# Patient Record
Sex: Male | Born: 1980 | Race: White | Hispanic: No | Marital: Single | Smoking: Never smoker
Health system: Southern US, Community
[De-identification: ages and names within clinical notes are randomized; demographics above are authoritative.]

## PROBLEM LIST (undated history)

## (undated) ENCOUNTER — Emergency Department (HOSPITAL_COMMUNITY): Payer: BC Managed Care – PPO

## (undated) DIAGNOSIS — E663 Overweight: Secondary | ICD-10-CM

## (undated) DIAGNOSIS — IMO0002 Reserved for concepts with insufficient information to code with codable children: Secondary | ICD-10-CM

## (undated) DIAGNOSIS — R51 Headache: Secondary | ICD-10-CM

## (undated) HISTORY — DX: Overweight: E66.3

## (undated) HISTORY — DX: Reserved for concepts with insufficient information to code with codable children: IMO0002

## (undated) HISTORY — DX: Headache: R51

## (undated) HISTORY — PX: NASAL POLYP EXCISION: SHX2068

---

## 2007-04-13 ENCOUNTER — Encounter (INDEPENDENT_AMBULATORY_CARE_PROVIDER_SITE_OTHER): Payer: Self-pay | Admitting: Specialist

## 2007-04-13 ENCOUNTER — Ambulatory Visit (HOSPITAL_COMMUNITY): Admission: RE | Admit: 2007-04-13 | Discharge: 2007-04-14 | Payer: Self-pay | Admitting: Otolaryngology

## 2008-07-19 IMAGING — CR DG CHEST 2V
2 series · 2 of 2 positions shown · non-contrast
Comparison: none

CLINICAL DATA: Deviated septum.  Preoperative chest. 
 CHEST - 2 VIEW:

[view not recorded (1 of 2)]
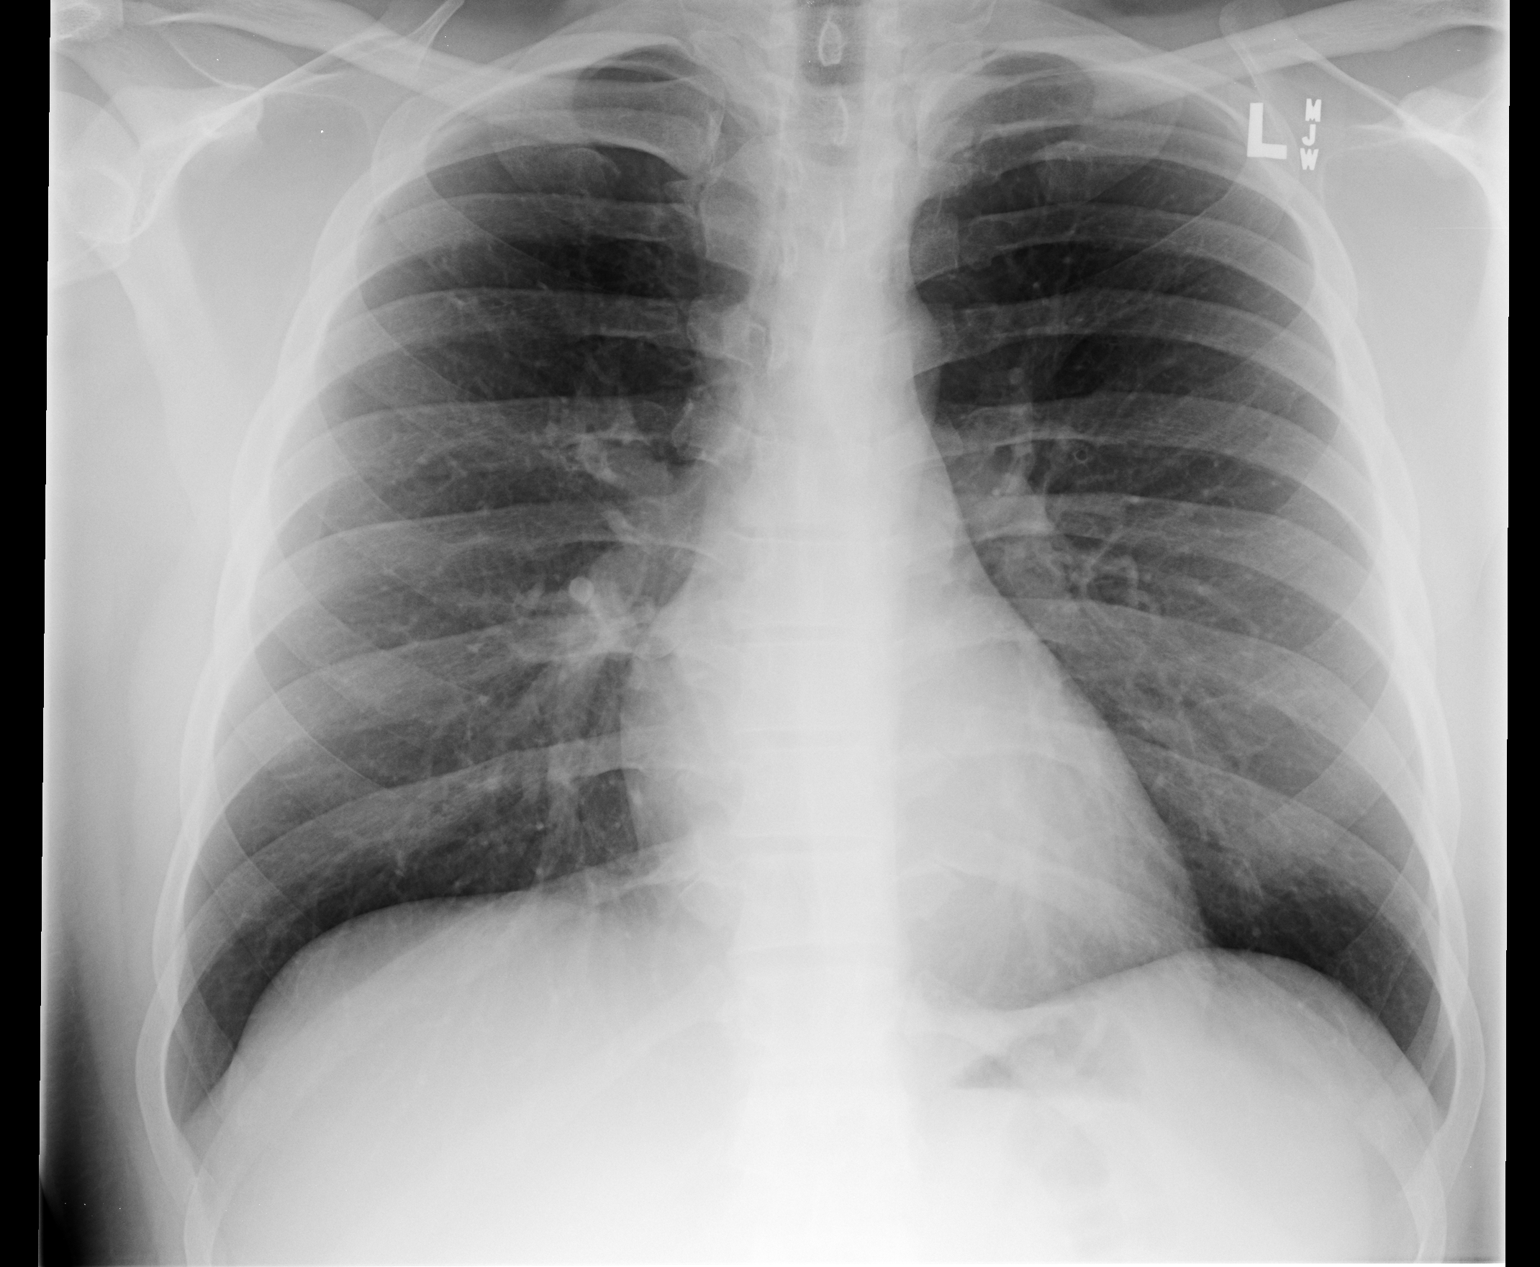

[view not recorded (2 of 2)]
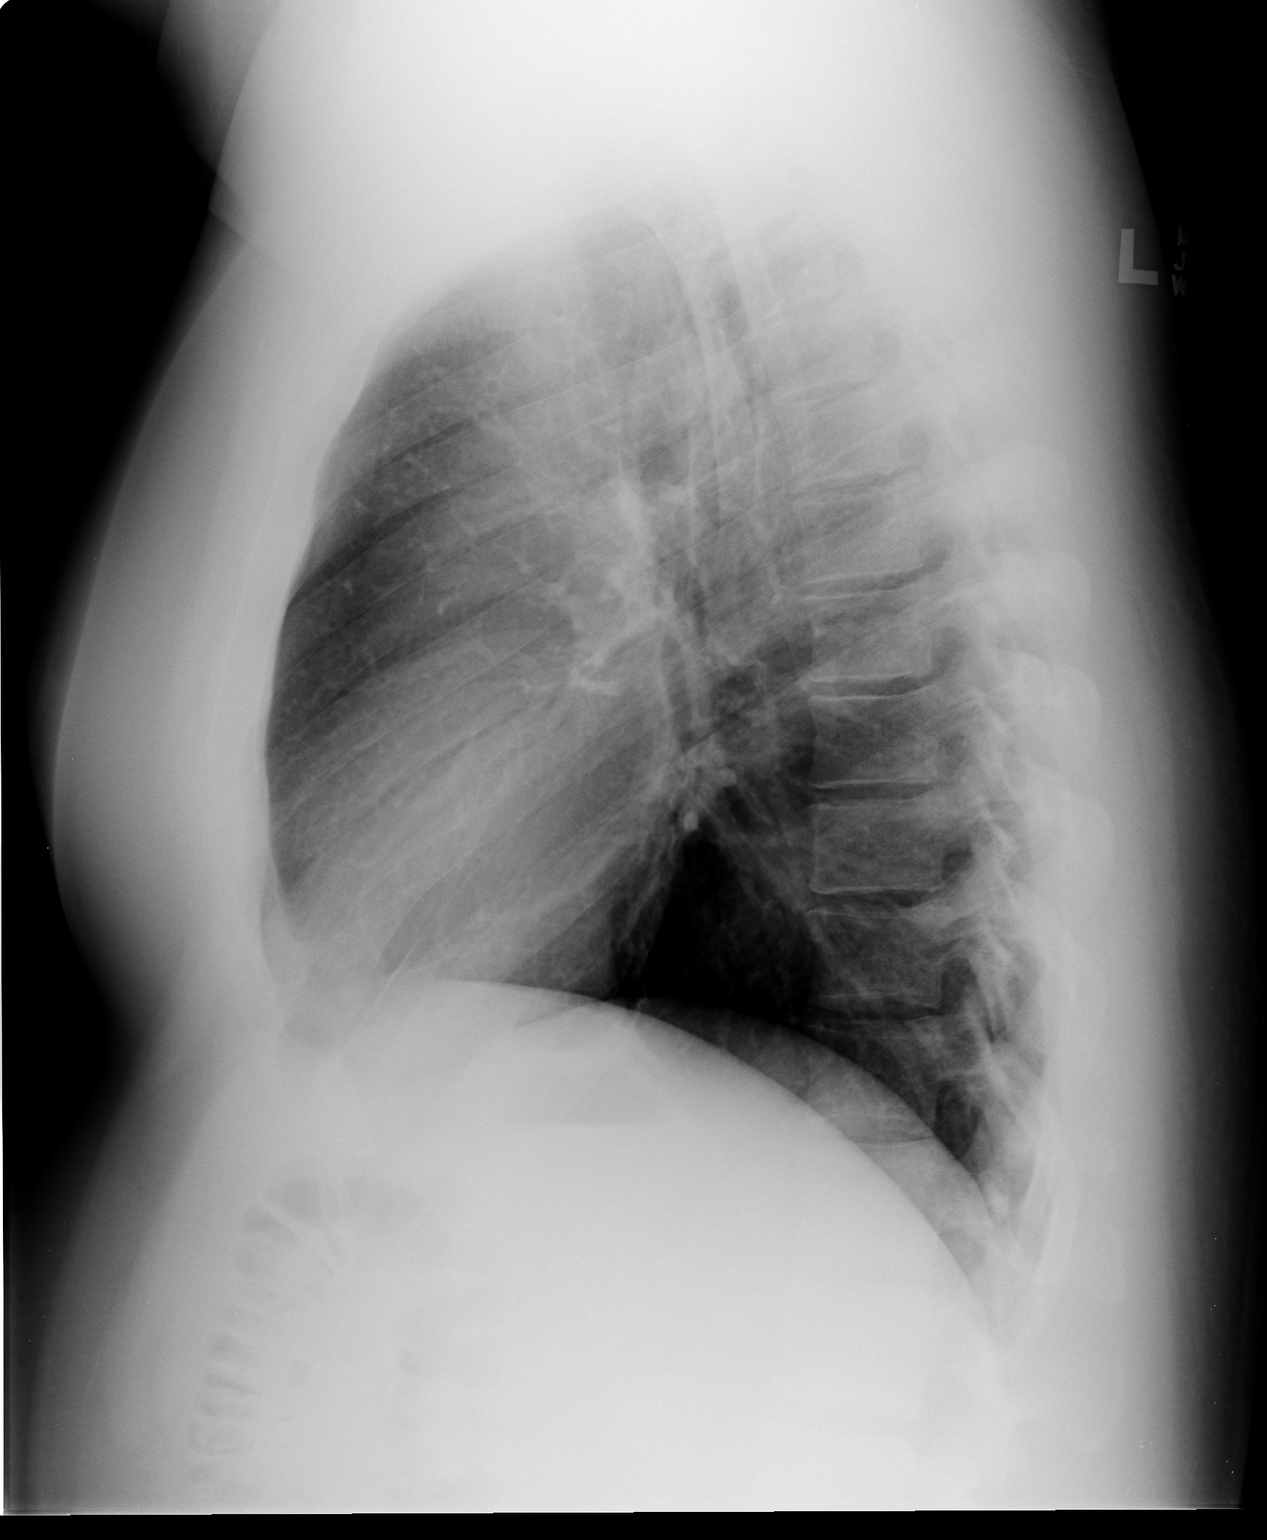

[2 of 2 positions shown; findings below may reference images not displayed]

FINDINGS: The lungs are clear and the heart and mediastinal structures are normal.
IMPRESSION: No evidence for active chest disease.

## 2008-08-31 ENCOUNTER — Emergency Department (HOSPITAL_COMMUNITY): Admission: EM | Admit: 2008-08-31 | Discharge: 2008-08-31 | Payer: Self-pay | Admitting: Emergency Medicine

## 2009-12-09 IMAGING — CR DG CHEST 2V
2 series · 2 of 2 positions shown · non-contrast
Comparison: 04/11/2007.

CLINICAL DATA: Cough and fever.  Body aches.

CHEST - 2 VIEW

[w chest pa]
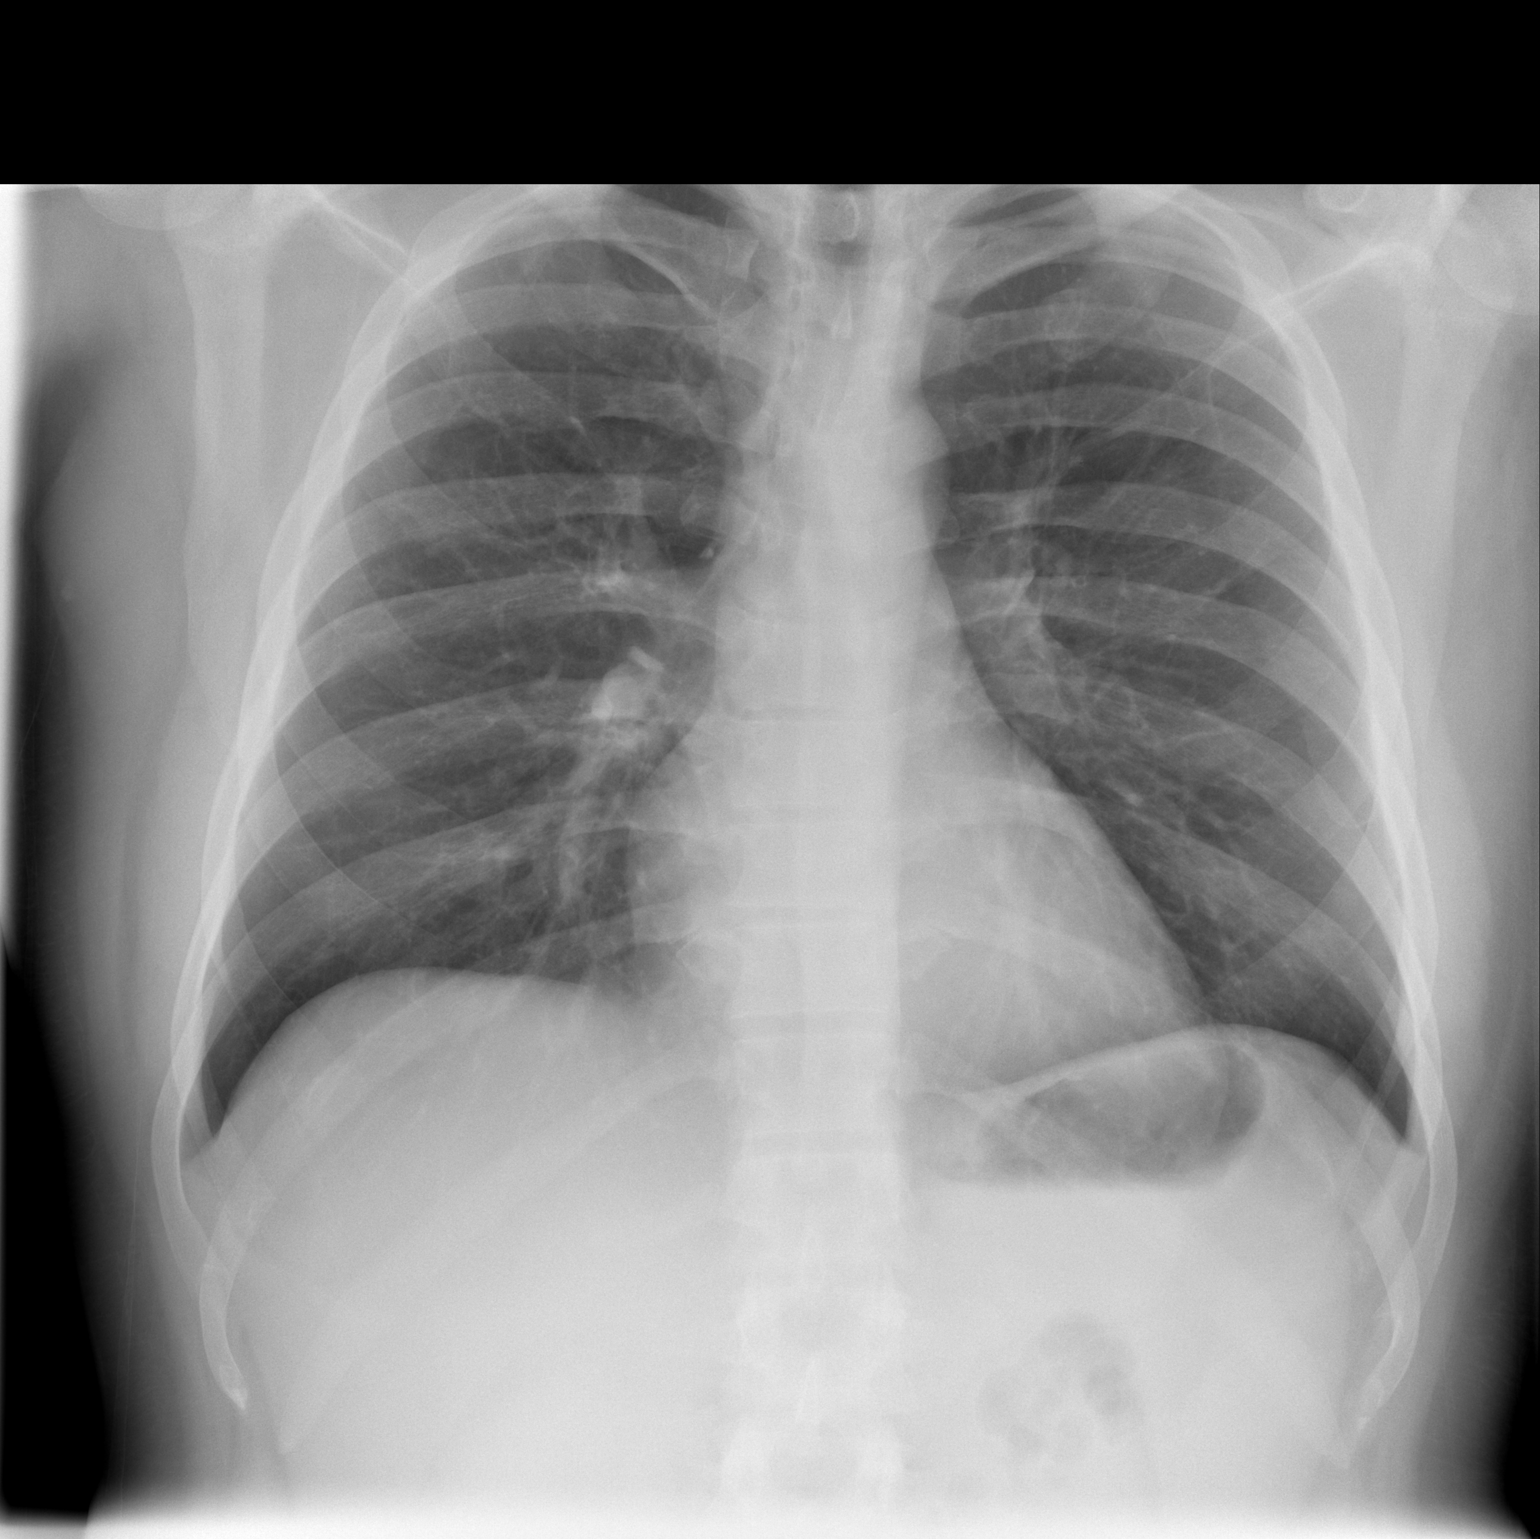

[w chest lat]
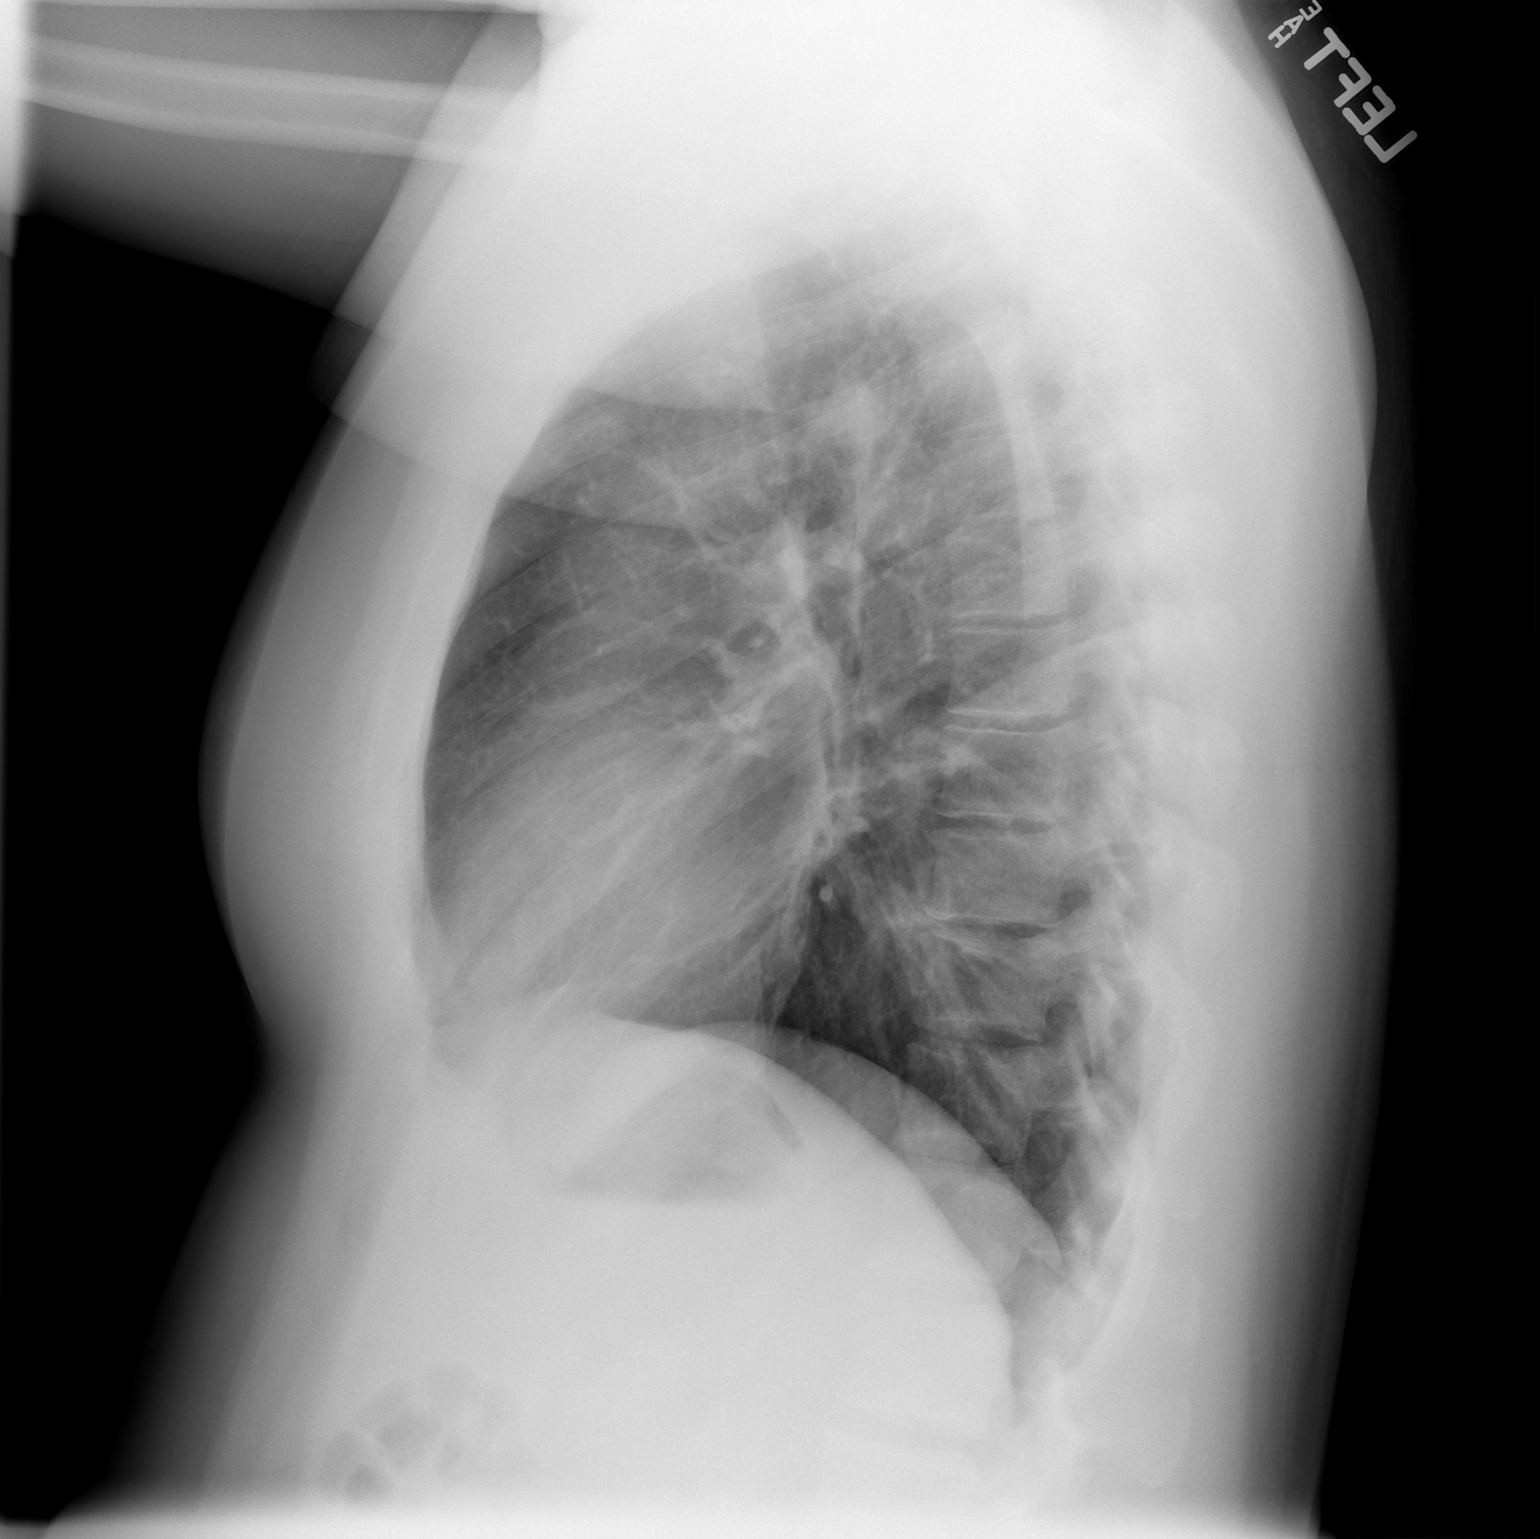

[2 of 2 positions shown; findings below may reference images not displayed]

FINDINGS: The heart size and mediastinal contours are stable.  The
lungs are clear.  There is no pleural effusion or pneumothorax.
IMPRESSION: Stable examination.  No active cardiopulmonary process.

## 2010-02-11 ENCOUNTER — Ambulatory Visit: Payer: Self-pay | Admitting: Internal Medicine

## 2010-02-11 DIAGNOSIS — E663 Overweight: Secondary | ICD-10-CM

## 2010-02-11 DIAGNOSIS — IMO0002 Reserved for concepts with insufficient information to code with codable children: Secondary | ICD-10-CM | POA: Insufficient documentation

## 2010-02-11 HISTORY — DX: Overweight: E66.3

## 2010-02-11 HISTORY — DX: Reserved for concepts with insufficient information to code with codable children: IMO0002

## 2010-02-17 ENCOUNTER — Encounter: Admission: RE | Admit: 2010-02-17 | Discharge: 2010-02-17 | Payer: Self-pay | Admitting: Internal Medicine

## 2010-02-17 ENCOUNTER — Telehealth: Payer: Self-pay | Admitting: Internal Medicine

## 2010-02-23 ENCOUNTER — Encounter: Payer: Self-pay | Admitting: Internal Medicine

## 2010-03-18 ENCOUNTER — Encounter: Payer: Self-pay | Admitting: Internal Medicine

## 2010-09-08 ENCOUNTER — Ambulatory Visit: Payer: Self-pay | Admitting: Internal Medicine

## 2010-09-08 DIAGNOSIS — R51 Headache: Secondary | ICD-10-CM

## 2010-09-08 DIAGNOSIS — R519 Headache, unspecified: Secondary | ICD-10-CM | POA: Insufficient documentation

## 2010-09-08 HISTORY — DX: Headache: R51

## 2010-10-13 ENCOUNTER — Ambulatory Visit: Payer: Self-pay | Admitting: Internal Medicine

## 2010-10-18 ENCOUNTER — Telehealth: Payer: Self-pay | Admitting: Internal Medicine

## 2010-11-17 ENCOUNTER — Ambulatory Visit: Payer: Self-pay | Admitting: Internal Medicine

## 2011-01-04 NOTE — Consult Note (Signed)
Summary: [facility]  [facility]   Imported By: Maryln Gottron 03/17/2010 12:39:03  _____________________________________________________________________  External Attachment:    Type:   Image     Comment:   External Document

## 2011-01-04 NOTE — Assessment & Plan Note (Signed)
Summary: 1  month rov/njr/pt rescd//ccm   Vital Signs:  Patient profile:   30 year old male Weight:      244 pounds Temp:     97.6 degrees F oral BP sitting:   110 / 74  (right arm) Cuff size:   regular  Vitals Entered By: Duard Brady LPN (October 13, 2010 8:02 AM) CC: 1 mos rov - still with migraines    **declines flu vaccine  Is Patient Diabetic? No   CC:  1 mos rov - still with migraines    **declines flu vaccine .  History of Present Illness: 77 -year-old patient who is seen today for follow up of his migraine headaches.  Over the past month.  He has had two severe migraines, lasting several hours that have interfered with his work.  He has had several less severe headaches.  There is been no clear benefit with Maxalt.  He is not tried Excedrin Migraine.  The analgesics relieve the headaches after several hours.  There is sometimes associated with photophobia and nausea. He feels his mother may have had migraine headaches.  He has missed today's at work over the past month due to severe headaches  Allergies (verified): No Known Drug Allergies  Past History:  Past Medical History: Reviewed history from 09/08/2010 and no changes required. lumbar radiculopathy mild obesity Headache  Past Surgical History: Reviewed history from 02/11/2010 and no changes required. nasal polyp, removed 2008  Review of Systems       The patient complains of headaches.  The patient denies anorexia, fever, weight loss, weight gain, vision loss, decreased hearing, hoarseness, chest pain, syncope, dyspnea on exertion, peripheral edema, prolonged cough, hemoptysis, abdominal pain, melena, hematochezia, severe indigestion/heartburn, hematuria, incontinence, genital sores, muscle weakness, suspicious skin lesions, transient blindness, difficulty walking, depression, unusual weight change, abnormal bleeding, enlarged lymph nodes, angioedema, breast masses, and testicular masses.    Physical  Exam  General:  overweight-appearing.  overweight-appearing.   Head:  Normocephalic and atraumatic without obvious abnormalities. No apparent alopecia or balding. Eyes:  No corneal or conjunctival inflammation noted. EOMI. Perrla. Funduscopic exam benign, without hemorrhages, exudates or papilledema. Vision grossly normal. Ears:  External ear exam shows no significant lesions or deformities.  Otoscopic examination reveals clear canals, tympanic membranes are intact bilaterally without bulging, retraction, inflammation or discharge. Hearing is grossly normal bilaterally. Mouth:  Oral mucosa and oropharynx without lesions or exudates.  Teeth in good repair. Neck:  No deformities, masses, or tenderness noted. Lungs:  Normal respiratory effort, chest expands symmetrically. Lungs are clear to auscultation, no crackles or wheezes. Heart:  Normal rate and regular rhythm. S1 and S2 normal without gallop, murmur, click, rub or other extra sounds.   Impression & Recommendations:  Problem # 1:  HEADACHE (ICD-784.0)  His updated medication list for this problem includes:    Tramadol Hcl 50 Mg Tabs (Tramadol hcl) .Marland Kitchen... Prn    Hydrocodone-acetaminophen 5-500 Mg Tabs (Hydrocodone-acetaminophen) ..... One or two every 6 hours as needed for pain due to the  frequency and severity.  Will place on Topamax prophylaxis  His updated medication list for this problem includes:    Tramadol Hcl 50 Mg Tabs (Tramadol hcl) .Marland Kitchen... Prn    Hydrocodone-acetaminophen 5-500 Mg Tabs (Hydrocodone-acetaminophen) ..... One or two every 6 hours as needed for pain  Complete Medication List: 1)  Tramadol Hcl 50 Mg Tabs (Tramadol hcl) .... Prn 2)  Hydrocodone-acetaminophen 5-500 Mg Tabs (Hydrocodone-acetaminophen) .... One or two every 6 hours  as needed for pain 3)  Topiramate 25 Mg Tabs (Topiramate) .... One at bedtime for one week, then one twice daily 4)  Topiramate 100 Mg Tabs (Topiramate) .... One twice daily 5)  Adalat Cc  30 Mg Xr24h-tab (Nifedipine) .... Take one (1) by mouth daily 6)  Topiramate 50 Mg Tabs (Topiramate) .... One twice daily  Patient Instructions: 1)  topamax 25 one at bedtime for one week, 2)  one twice daily for one week; 3)  one am and 2 pm for one week; 4)  50 mg twice daily for one week;and 5)  50 mg am amd 75 mg pm for one week; 6)  75 mg twice daily for one week; 7)  75 am and 100 mg PM for one week; 100 mg twice daily   8)  Please schedule a follow-up appointment in 6 weeks Prescriptions: TOPIRAMATE 50 MG TABS (TOPIRAMATE) one twice daily  #50 x 0   Entered and Authorized by:   Gordy Savers  MD   Signed by:   Gordy Savers  MD on 10/13/2010   Method used:   Print then Give to Patient   RxID:   [id number] TOPIRAMATE 100 MG TABS (TOPIRAMATE) one twice daily  #50 x 0   Entered and Authorized by:   Gordy Savers  MD   Signed by:   Gordy Savers  MD on 10/13/2010   Method used:   Print then Give to Patient   RxID:   [id number] TOPIRAMATE 25 MG TABS (TOPIRAMATE) one at bedtime for one week, then one twice daily  #50 x 0   Entered and Authorized by:   Gordy Savers  MD   Signed by:   Gordy Savers  MD on 10/13/2010   Method used:   Print then Give to Patient   RxID:   [id number]    Orders Added: 1)  Est. Patient Level III [01027]

## 2011-01-04 NOTE — Assessment & Plan Note (Signed)
Summary: HEADACHES//CCM   Vital Signs:  Patient profile:   30 year old male Weight:      237 pounds Temp:     98.0 degrees F oral BP sitting:   122 / 80  (right arm) Cuff size:   regular  Vitals Entered By: Duard Brady LPN (September 08, 2010 8:38 AM) CC: c/o headaches and nausea on/off for sometime Is Patient Diabetic? No   CC:  c/o headaches and nausea on/off for sometime.  History of Present Illness: 30 year old patient who has had 3 to 4 severe headaches over the past two months.  These  occasionally began in the right temporal area, but quickly become generalized.  They are described as quite severe and associated with nausea, light and noise sensitivity.  They last hours and are debilitating.  He feels his mother may have had migraine headaches.  Denies any fever, neck pain, or other constitutional symptoms.  Preventive Screening-Counseling & Management  Alcohol-Tobacco     Smoking Status: never  Allergies (verified): No Known Drug Allergies  Past History:  Past Medical History: lumbar radiculopathy mild obesity Headache  Family History: Reviewed history from 02/11/2010 and no changes required. both parents are in their early to mid 45s with low back pain.  Mother history of herniated disk, and history of hypertension  One half-brother, and one half sister in good health  Social History: Smoking Status:  never  Review of Systems       The patient complains of headaches.  The patient denies anorexia, fever, weight loss, weight gain, vision loss, decreased hearing, hoarseness, chest pain, syncope, dyspnea on exertion, peripheral edema, prolonged cough, hemoptysis, abdominal pain, melena, hematochezia, severe indigestion/heartburn, hematuria, incontinence, genital sores, muscle weakness, suspicious skin lesions, transient blindness, difficulty walking, depression, unusual weight change, abnormal bleeding, enlarged lymph nodes, angioedema, breast masses, and  testicular masses.    Physical Exam  General:  overweight-appearing.  normal blood pressure Head:  Normocephalic and atraumatic without obvious abnormalities. No apparent alopecia or balding. Eyes:  No corneal or conjunctival inflammation noted. EOMI. Perrla. Funduscopic exam benign, without hemorrhages, exudates or papilledema. Vision grossly normal. Ears:  External ear exam shows no significant lesions or deformities.  Otoscopic examination reveals clear canals, tympanic membranes are intact bilaterally without bulging, retraction, inflammation or discharge. Hearing is grossly normal bilaterally. Mouth:  Oral mucosa and oropharynx without lesions or exudates.  Teeth in good repair. Neck:  No deformities, masses, or tenderness noted. Lungs:  Normal respiratory effort, chest expands symmetrically. Lungs are clear to auscultation, no crackles or wheezes. Heart:  Normal rate and regular rhythm. S1 and S2 normal without gallop, murmur, click, rub or other extra sounds. Abdomen:  Bowel sounds positive,abdomen soft and non-tender without masses, organomegaly or hernias noted.   Impression & Recommendations:  Problem # 1:  HEADACHE (ICD-784.0)  The following medications were removed from the medication list:    Lortab 5-500 Mg Tabs (Hydrocodone-acetaminophen) ..... Q4hours as needed pain His updated medication list for this problem includes:    Tramadol Hcl 50 Mg Tabs (Tramadol hcl) .Marland Kitchen... Prn    Hydrocodone-acetaminophen 5-500 Mg Tabs (Hydrocodone-acetaminophen) ..... One or two every 6 hours as needed for pain  The following medications were removed from the medication list:    Lortab 5-500 Mg Tabs (Hydrocodone-acetaminophen) ..... Q4hours as needed pain His updated medication list for this problem includes:    Tramadol Hcl 50 Mg Tabs (Tramadol hcl) .Marland Kitchen... Prn  Complete Medication List: 1)  Tramadol Hcl 50 Mg  Tabs (Tramadol hcl) .... Prn 2)  Hydrocodone-acetaminophen 5-500 Mg Tabs  (Hydrocodone-acetaminophen) .... One or two every 6 hours as needed for pain  Patient Instructions: 1)  Please schedule a follow-up appointment in 1 month. 2)  trial Excedrin Migraine 3)  Maxalt- one, initially, and then may repeat in 4 hours, not to exceed two in a 24-hour period Prescriptions: HYDROCODONE-ACETAMINOPHEN 5-500 MG TABS (HYDROCODONE-ACETAMINOPHEN) one or two every 6 hours as needed for pain  #50 x 0   Entered and Authorized by:   Gordy Savers  MD   Signed by:   Gordy Savers  MD on 09/08/2010   Method used:   Print then Give to Patient   RxID:   [id number]

## 2011-01-04 NOTE — Assessment & Plan Note (Signed)
Summary: TO BE EST/NJR/pt rescd//ccm   Vital Signs:  Patient profile:   30 year old male Height:      69 inches Temp:     98.5 degrees F oral BP sitting:   110 / 70  (left arm)  Vitals Entered By: Duard Brady LPN (February 11, 2010 9:40 AM) CC: new p t- c/o legpain  Is Patient Diabetic? No   CC:  new p t- c/o legpain .  History of Present Illness: 30 year old patient, who presents with a 4-week history of right hip and right leg pain.  He describes a sharp, burning pain involving the left buttock and right leg;  pain radiates to the lateral proximal right calf area.  Denies any motor weakness.  No prior history of low back pain.  He has been treated with analgesics and steroids without benefit.  He felt that he was modestly improved while taking steroids.  Preventive Screening-Counseling & Management  Alcohol-Tobacco     Smoking Status: current  Past History:  Past Medical History: lumbar radiculopathy mild obesity  Past Surgical History: nasal polyp, removed 2008  Family History: Reviewed history and no changes required. both parents are in their early to mid 61s with low back pain.  Mother history of herniated disk, and history of hypertension  One half-brother, and one half sister in good health  Social History: Reviewed history and no changes required. 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  The patient denies anorexia, fever, weight loss, weight gain, vision loss, decreased hearing, hoarseness, chest pain, syncope, dyspnea on exertion, peripheral edema, prolonged cough, headaches, hemoptysis, abdominal pain, melena, hematochezia, severe indigestion/heartburn, hematuria, incontinence, genital sores, muscle weakness, suspicious skin lesions, transient blindness, depression, unusual weight change, abnormal bleeding, enlarged lymph nodes,  angioedema, breast masses, and testicular masses.    Physical Exam  General:  overweight-appearing.  normal blood pressure Head:  Normocephalic and atraumatic without obvious abnormalities. No apparent alopecia or balding. Eyes:  No corneal or conjunctival inflammation noted. EOMI. Perrla. Funduscopic exam benign, without hemorrhages, exudates or papilledema. Vision grossly normal. Ears:  External ear exam shows no significant lesions or deformities.  Otoscopic examination reveals clear canals, tympanic membranes are intact bilaterally without bulging, retraction, inflammation or discharge. Hearing is grossly normal bilaterally. Nose:  External nasal examination shows no deformity or inflammation. Nasal mucosa are pink and moist without lesions or exudates. Mouth:  Oral mucosa and oropharynx without lesions or exudates.  Teeth in good repair. Neck:  No deformities, masses, or tenderness noted. Chest Wall:  No deformities, masses, tenderness or gynecomastia noted. Breasts:  No masses or gynecomastia noted Lungs:  Normal respiratory effort, chest expands symmetrically. Lungs are clear to auscultation, no crackles or wheezes. Heart:  Normal rate and regular rhythm. S1 and S2 normal without gallop, murmur, click, rub or other extra sounds. Abdomen:  Bowel sounds positive,abdomen soft and non-tender without masses, organomegaly or hernias noted. Genitalia:  Testes bilaterally descended without nodularity, tenderness or masses. No scrotal masses or lesions. No penis lesions or urethral discharge. Msk:  positive straight leg test on the right Pulses:  R and L carotid,radial,femoral,dorsalis pedis and posterior tibial pulses are full and equal bilaterally Extremities:  No clubbing, cyanosis, edema, or deformity noted with normal full range of motion of all joints.   Neurologic:  alert & oriented X3, cranial nerves II-XII intact, strength normal in all  extremities, sensation intact to light touch, and  gait normal.   able to walk on his toes and heels without difficulty Achilles reflexes normal and brisk bilaterally   Impression & Recommendations:  Problem # 1:  LUMBAR RADICULOPATHY, RIGHT (ICD-724.4)  His updated medication list for this problem includes:    Lortab 5-500 Mg Tabs (Hydrocodone-acetaminophen) ..... Q4hours as needed pain unclear whether this represents L5 or S1; pain is referred to the proximal calf and not more distal-this seems most compatible with L5 radiculopathy  Orders: Radiology Referral (Radiology)  Problem # 2:  OVERWEIGHT (ICD-278.02)  Complete Medication List: 1)  Lortab 5-500 Mg Tabs (Hydrocodone-acetaminophen) .... Q4hours as needed pain 2)  Prednisone 20 Mg Tabs (Prednisone) .... One twice daily  Patient Instructions: 1)  lumbar MRI as scheduled 2)  call if there is any worsening pain or weakness involving the right leg 3)  You need to lose weight. Consider a lower calorie diet and regular exercise.  Prescriptions: PREDNISONE 20 MG TABS (PREDNISONE) one twice daily  #20 x 0   Entered and Authorized by:   Gordy Savers  MD   Signed by:   Gordy Savers  MD on 02/11/2010   Method used:   Print then Give to Patient   RxID:   [id number] LORTAB 5-500 MG TABS (HYDROCODONE-ACETAMINOPHEN) q4hours as needed pain  #50 x 0   Entered and Authorized by:   Gordy Savers  MD   Signed by:   Gordy Savers  MD on 02/11/2010   Method used:   Print then Give to Patient   RxID:   [id number]

## 2011-01-04 NOTE — Progress Notes (Signed)
Summary: refill  Phone Note Refill Request Call back at Home Phone [phone] Message from:  wife---walk in  Refills Requested: Medication #1:  HYDROCODONE-ACETAMINOPHEN 5-500 MG TABS one or two every 6 hours as needed for pain [facility]  Initial call taken by: Warnell Forester,  October 18, 2010 3:12 PM  Follow-up for Phone Call        [facility]   KIK Follow-up by: Duard Brady LPN,  October 18, 2010 5:29 PM    Prescriptions: HYDROCODONE-ACETAMINOPHEN 5-500 MG TABS (HYDROCODONE-ACETAMINOPHEN) one or two every 6 hours as needed for pain  #50 x 2   Entered by:   Duard Brady LPN   Authorized by:   Gordy Savers  MD   Signed by:   Duard Brady LPN on 65/78/4696   Method used:   Historical   RxID:   [id number]  #50  RF 2

## 2011-01-04 NOTE — Progress Notes (Signed)
Summary: REQ FOR REFILLS - referral   Phone Note Call from Patient Call back at Home Phone [phone]   Caller: Patient [phone] Reason for Call: Refill Medication Summary of Call: Pt is req a refill on meds: LORTAB 5-500 MG TABS (HYDROCODONE-ACETAMINOPHEN)  -and-  PREDNISONE 20 MG TABS (PREDNISONE)..... Pt adv that he has been having a lot of pain and is almost out of meds (5 lortab left, 8 prednisone left)..... Pt can be reached at [phone] to adv when ready or with any questions or concerns...Marland KitchenMarland Kitchen Pt uses [facility] [address]  Initial call taken by: Debbra Riding,  February 17, 2010 4:50 PM  Follow-up for Phone Call        and aPt called to check on status of refill. Pt wants this done asap today.Pt just took last pain pill and in about 1 hour, pt says that he will not be able to move.  Follow-up by: Lucy Antigua,  February 18, 2010 12:53 PM  Additional Follow-up for Phone Call Additional follow up Details #1::        #50 Additional Follow-up by: Gordy Savers  MD,  February 18, 2010 2:03 PM    Additional Follow-up for Phone Call Additional follow up Details #2::    called in lortab , pt aware of xray results and that we will be doing a referral to neurosurg.  Follow-up by: Duard Brady LPN,  February 18, 2010 3:32 PM  #50; notify patient that he has herniated disc; schedule neurosurgical eval

## 2011-01-06 NOTE — Assessment & Plan Note (Signed)
Summary: ROA/FUP/RCD   Vital Signs:  Patient profile:   30 year old male Weight:      242 pounds Temp:     98.2 degrees F oral BP sitting:   122 / 70  (right arm) Cuff size:   regular  Vitals Entered By: Duard Brady LPN (November 17, 2010 8:24 AM) CC: rov - improving Is Patient Diabetic? No   CC:  rov - improving.  History of Present Illness: 30 year old patient who is in today for follow-up of his migraine headaches.  He was placed on Topamax prophylaxis and has done remarkably well.  He's had no further headaches and he has tolerated medication quite well.  He has been titrated to a 100-mg b.i.d. dose.  His weight is down 2 pounds.  He has hypertension, which has been well-controlled.  Allergies (verified): No Known Drug Allergies  Past History:  Past Medical History: Reviewed history from 09/08/2010 and no changes required. lumbar radiculopathy mild obesity Headache  Review of Systems  The patient denies anorexia, fever, weight loss, weight gain, vision loss, decreased hearing, hoarseness, chest pain, syncope, dyspnea on exertion, peripheral edema, prolonged cough, headaches, hemoptysis, abdominal pain, melena, hematochezia, severe indigestion/heartburn, hematuria, incontinence, genital sores, muscle weakness, suspicious skin lesions, transient blindness, difficulty walking, depression, unusual weight change, abnormal bleeding, enlarged lymph nodes, angioedema, breast masses, and testicular masses.    Physical Exam  General:  overweight-appearing.  120/72overweight-appearing.     Impression & Recommendations:  Problem # 1:  HEADACHE (ICD-784.0)  His updated medication list for this problem includes:    Tramadol Hcl 50 Mg Tabs (Tramadol hcl) .Marland Kitchen... Prn    Hydrocodone-acetaminophen 5-500 Mg Tabs (Hydrocodone-acetaminophen) ..... One or two every 6 hours as needed for pain  His updated medication list for this problem includes:    Tramadol Hcl 50 Mg Tabs  (Tramadol hcl) .Marland Kitchen... Prn    Hydrocodone-acetaminophen 5-500 Mg Tabs (Hydrocodone-acetaminophen) ..... One or two every 6 hours as needed for pain  Problem # 2:  OVERWEIGHT (ICD-278.02)  Complete Medication List: 1)  Tramadol Hcl 50 Mg Tabs (Tramadol hcl) .... Prn 2)  Hydrocodone-acetaminophen 5-500 Mg Tabs (Hydrocodone-acetaminophen) .... One or two every 6 hours as needed for pain 3)  Topiramate 25 Mg Tabs (Topiramate) .... One at bedtime for one week, then one twice daily 4)  Topiramate 100 Mg Tabs (Topiramate) .... One twice daily 5)  Topiramate 50 Mg Tabs (Topiramate) .... One twice daily  Patient Instructions: 1)  Please schedule a follow-up appointment in 4 months. 2)  Limit your Sodium (Salt) to less than 2 grams a day(slightly less than 1/2 a teaspoon) to prevent fluid retention, swelling, or worsening of symptoms. 3)  It is important that you exercise regularly at least 20 minutes 5 times a week. If you develop chest pain, have severe difficulty breathing, or feel very tired , stop exercising immediately and seek medical attention. 4)  You need to lose weight. Consider a lower calorie diet and regular exercise.  Prescriptions: TOPIRAMATE 100 MG TABS (TOPIRAMATE) one twice daily  #180 x 3   Entered and Authorized by:   Gordy Savers  MD   Signed by:   Gordy Savers  MD on 11/17/2010   Method used:   [phone] ([facility]ail[facility]       [address]       [place], [place]  [address]       Ph: [phone]  or [phone]       Fax: [phone]   RxID:   [id number]    Orders Added: 1)  Est. Patient Level III [62952]

## 2011-01-07 NOTE — Letter (Signed)
Summary: [facility]  [facility]   Imported By: Maryln Gottron 05/04/2010 10:22:17  _____________________________________________________________________  External Attachment:    Type:   Image     Comment:   External Document

## 2011-02-15 ENCOUNTER — Other Ambulatory Visit: Payer: Self-pay

## 2011-02-15 MED ORDER — HYDROCODONE-ACETAMINOPHEN 5-500 MG PO TABS
1.0000 | ORAL_TABLET | Freq: Four times a day (QID) | ORAL | Status: AC | PRN
Start: 2011-02-15 — End: 2011-02-25

## 2011-02-15 NOTE — Telephone Encounter (Signed)
[facility] 

## 2011-02-15 NOTE — Telephone Encounter (Signed)
#  50  rf 2

## 2011-02-15 NOTE — Telephone Encounter (Signed)
Fax recv'd requesting refill on vicodin - last seen 11/17/10 , last fill 10/18/10 #50 2rf PLEASE ADVISE

## 2011-03-15 ENCOUNTER — Encounter: Payer: Self-pay | Admitting: Internal Medicine

## 2011-03-16 ENCOUNTER — Ambulatory Visit (INDEPENDENT_AMBULATORY_CARE_PROVIDER_SITE_OTHER): Payer: Self-pay | Admitting: Internal Medicine

## 2011-03-16 ENCOUNTER — Encounter: Payer: Self-pay | Admitting: Internal Medicine

## 2011-03-16 VITALS — BP 126/72 | Temp 98.7°F | Ht 70.0 in | Wt 241.0 lb

## 2011-03-16 DIAGNOSIS — R51 Headache: Secondary | ICD-10-CM

## 2011-03-16 NOTE — Progress Notes (Signed)
  Subjective:    Patient ID: Stephen Armstrong, male    DOB: October 18, 1981, 30 y.o.   MRN: [id number]  HPI  30 year old patient who is seen today for is migraine headaches. He was last seen 4 months ago and at that time had been titrated to a dose of Topamax 100 mg twice a day. He has done quite well on this regimen over the past 4 months he has had no debilitating migraine headaches. He has had a change in his job which he feels has been a positive factor. He really takes Topamax once or twice weekly and no longer on a regular preventative regimen. His main complaint today is some right shoulder pain. He does take analgesics for this occasionally. He has not required analgesics for any headache    Review of Systems  Musculoskeletal:       Episodic right shoulder pain  Neurological: Positive for headaches.       Objective:   Physical Exam  Constitutional: He appears well-developed and well-nourished. No distress.       Blood pressure 120/78 Moderately overweight          Assessment & Plan:  Migraine headaches. Clinically stable. Patient has self discontinued prophylactic Topamax. We'll continue off medication and observe at this time Right shoulder pain. Suggested when necessary anti-inflammatory medications. If shoulder pain persists or worsens will refer to orthopedics for evaluation

## 2011-03-16 NOTE — Patient Instructions (Signed)
Call or return to clinic prn if these symptoms worsen or fail to improve as anticipated.   Take Aleve 200 mg twice daily for pain or swelling 

## 2011-04-22 NOTE — Op Note (Signed)
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   PROCEDURE:  Right maxillary antrotomy with removal of right antrochoanal  polyp, septoplasty, bilateral inferior turbinate reduction.   SURGEON:  Lucky Cowboy, MD.   ANESTHESIA:  General.   ESTIMATED BLOOD LOSS:  Less than 50 cc.   SPECIMENS:  Right antrochoanal polyp.   COMPLICATIONS:  None.   INDICATIONS:  The patient is a 30 year old male who reports significant  right sided nasal obstruction.  He was noted to have an obstructing  right sided nasal polyps and a completely opacified right maxillary  sinus with a severe right sided septal deviation.  Further, there was  significant bilateral inferior turbinate hypertrophy.  For these  reasons, the above procedures are performed.   FINDINGS:  The patient was noted to have an extremely large right  antrochoanal polyp that filled the entire right nasal cavity and  extended into the nasopharynx.  It was coming out of the right maxillary  sinus.  There was a small amount of mucus in the sinus but was mainly  filled with the polyp.  There was no evidence of bony erosion.  There  was severe right cartilaginous and bony septal deviation.  There was a  significant amount of bilateral inferior bony and mucosal turbinate  hypertrophy.   PROCEDURE:  The patient was taken to the operating room and placed on  the table in the supine  position.  He was then placed under a general  endotracheal anesthesia and the table rotated clockwise 45 degrees.  Head was prepped with Betadine and the patient draped in the usual  sterile fashion.  One (1%) percent lidocaine with 1:100,000 of  epinephrine was used to inject both sides of the nasal septum.  After  allowing time for vasoconstrictive effect of the Afrin and local, a left  hemi transfixion incision was made using a #15 blade and  submucoperichondrial and mucoperiosteal flaps elevated.  The bony  cartilaginous junction was divided using a Therapist, nutritional and the  contralateral posterior flaps elevated likewise.  The posterior portion  of the septum was taken down using open [employer] forceps.  Care  was used not to disturb the superior septum.  The anterior septum was  then released from the maxillary crest.  A portion of the maxillary  crest was taken down using a 4-mm osteotome.  This allowed the  cartilaginous septum to medialize.  The incision was closed at the end  of the case using 4-0 chromic.  Attention was then turned to the right sided polyp removal.  Local of  the same type was injected into the polyp.  Microdebrider was then used  to remove the polyp in total.  This required the zero and 30 degrees  [employer] endoscopes.  The maxillary antrotomy site had been  somewhat opened and was trimmed up using the microdebrider.  Polyp had  to be taken out of the sinus to clear it out.  Care was taken to  minimize mucosal damage.  No bony destruction was identified.  No other  masses were noted.  Both the inferior turbinates were then injected with  1% lidocaine with 1:100,000 of epinephrine.  Again after allowing time  for vasoconstrictive effect, the inferior one half of the turbinates  were taken down using the microdebrider, followed by the Tru-Cut  forceps.  Suction cautery was used for hemostasis.  Rolled Telfa coated  with Bactroban ointment was placed  in each side of the nasal cavity and  tied to one another anterior to the columella.  The oral cavity was  suctioned.  The table was rotated clockwise 45 degrees.  A drip pad was  placed and the patient awakened from anesthesia.  He was taken to the  Post Anesthesia Care Unit in stable condition.  No complications.      Lucky Cowboy, MD  Electronically Signed     SJ/MEDQ  D:  05/06/2007  T:  05/06/2007  Job:  [id number]   cc:   [facility]

## 2011-05-04 ENCOUNTER — Encounter: Payer: Self-pay | Admitting: Internal Medicine

## 2011-05-04 ENCOUNTER — Ambulatory Visit (INDEPENDENT_AMBULATORY_CARE_PROVIDER_SITE_OTHER): Payer: BC Managed Care – PPO | Admitting: Internal Medicine

## 2011-05-04 DIAGNOSIS — IMO0002 Reserved for concepts with insufficient information to code with codable children: Secondary | ICD-10-CM

## 2011-05-04 DIAGNOSIS — E663 Overweight: Secondary | ICD-10-CM

## 2011-05-04 MED ORDER — HYDROCODONE-ACETAMINOPHEN 5-500 MG PO TABS: 1.0000 | ORAL_TABLET | Freq: Four times a day (QID) | ORAL | Status: DC | PRN

## 2011-05-04 MED ORDER — METHYLPREDNISOLONE ACETATE 80 MG/ML IJ SUSP
80.0000 mg | Freq: Once | INTRAMUSCULAR | Status: AC
  Administered 2011-05-04: 80 mg via INTRAMUSCULAR

## 2011-05-04 NOTE — Patient Instructions (Signed)
Most patients with low back pain will improve with time over the next two to 6 weeks.  Keep active but avoid any activities that cause pain.  Apply moist heat to the low back area several times daily.   Take Aleve 200 mg twice daily for pain or swelling  Call or return to clinic prn if these symptoms worsen or fail to improve as anticipated.

## 2011-05-04 NOTE — Progress Notes (Signed)
  Subjective:    Patient ID: Stephen Armstrong, male    DOB: 1981/07/04, 30 y.o.   MRN: [id number]  HPI  30 year old patient who has had a prior lumbar laminectomy. He presents with about one-week history of pain in the right lumbar region area of pain does radiate to the right buttock area. This is similar to his problem one year ago but much less severe. Pain also radiated much further down the right leg in the past prior to his laminectomy. He denies any motor weakness    Review of Systems  Musculoskeletal: Positive for back pain.       Objective:   Physical Exam  Constitutional: He appears well-developed and well-nourished. No distress.  Musculoskeletal:       Well-healed lumbar laminectomy scar Straight leg  testing was negative No motor weakness. Patient was easily able to walk on his toes and heels without difficulty Achilles reflexes intact          Assessment & Plan:   Lumbar pain History lumbar radiculopathy status post laminectomy  We'll treat with Depo-Medrol analgesics rest and compresses. He'll call if he fails to improve

## 2011-05-05 ENCOUNTER — Telehealth: Payer: Self-pay | Admitting: Internal Medicine

## 2011-05-05 MED ORDER — HYDROCODONE-ACETAMINOPHEN 5-325 MG PO TABS: 1.0000 | ORAL_TABLET | Freq: Four times a day (QID) | ORAL | Status: AC | PRN

## 2011-05-05 NOTE — Telephone Encounter (Signed)
Okay for 5/325

## 2011-05-05 NOTE — Telephone Encounter (Signed)
vicoden 5-500 is on manufacturer's back order. Would like to change to different dosage in generic.

## 2011-05-05 NOTE — Telephone Encounter (Signed)
Called [facility]  [facility]

## 2011-05-05 NOTE — Telephone Encounter (Signed)
Please advise -i spoke with pharmacist at [facility] - and she confirmed that there is a backorder - could be 3 wks before they release - [facility] at [facility].

## 2011-06-29 ENCOUNTER — Other Ambulatory Visit: Payer: Self-pay

## 2011-06-29 MED ORDER — HYDROCODONE-ACETAMINOPHEN 5-500 MG PO TABS: 1.0000 | ORAL_TABLET | Freq: Four times a day (QID) | ORAL | Status: DC | PRN

## 2011-06-29 NOTE — Telephone Encounter (Signed)
OK to RF

## 2011-06-29 NOTE — Telephone Encounter (Signed)
Fax refill request for vicodin 5-500 last written 02/15/11 #50 2RF , was called in on 05/05/11 5-325mg  #50 2RF due to back order on 5-500. PLease advise

## 2011-06-29 NOTE — Telephone Encounter (Signed)
[facility] 

## 2011-08-18 ENCOUNTER — Encounter: Payer: Self-pay | Admitting: Internal Medicine

## 2011-08-18 ENCOUNTER — Ambulatory Visit (INDEPENDENT_AMBULATORY_CARE_PROVIDER_SITE_OTHER): Payer: BC Managed Care – PPO | Admitting: Internal Medicine

## 2011-08-18 DIAGNOSIS — J45909 Unspecified asthma, uncomplicated: Secondary | ICD-10-CM

## 2011-08-18 DIAGNOSIS — R51 Headache: Secondary | ICD-10-CM

## 2011-08-18 MED ORDER — DOXYCYCLINE HYCLATE 100 MG PO TABS: 100.0000 mg | ORAL_TABLET | Freq: Two times a day (BID) | ORAL | Status: AC

## 2011-08-18 NOTE — Progress Notes (Signed)
  Subjective:    Patient ID: Stephen Armstrong, male    DOB: 1981/06/05, 30 y.o.   MRN: [id number]  HPI  30 year old patient who presents with a five-day history of acute illness. He has had fever head and chest congestion. He is now expectorating green mucus from both the sinuses and pulmonary. He is developed worsening chest congestion and intermittent wheezing especially at night. His fever may have improved. He has been using Tylenol and Alka-Seltzer plus and occasional Casimiro Needle well. He feels unwell no shortness of breath or chest pain    Review of Systems  Constitutional: Positive for fever and fatigue. Negative for chills and appetite change.  HENT: Positive for congestion and rhinorrhea. Negative for hearing loss, ear pain, sore throat, trouble swallowing, neck stiffness, dental problem, voice change and tinnitus.   Eyes: Negative for pain, discharge and visual disturbance.  Respiratory: Positive for cough and wheezing. Negative for chest tightness and stridor.   Cardiovascular: Negative for chest pain, palpitations and leg swelling.  Gastrointestinal: Negative for nausea, vomiting, abdominal pain, diarrhea, constipation, blood in stool and abdominal distention.  Genitourinary: Negative for urgency, hematuria, flank pain, discharge, difficulty urinating and genital sores.  Musculoskeletal: Negative for myalgias, back pain, joint swelling, arthralgias and gait problem.  Skin: Negative for rash.  Neurological: Negative for dizziness, syncope, speech difficulty, weakness, numbness and headaches.  Hematological: Negative for adenopathy. Does not bruise/bleed easily.  Psychiatric/Behavioral: Negative for behavioral problems and dysphoric mood. The patient is not nervous/anxious.        Objective:   Physical Exam  Constitutional: He is oriented to person, place, and time. He appears well-developed and well-nourished.       Appears unwell but in no acute distress  HENT:  Head: Normocephalic.    Right Ear: External ear normal.  Left Ear: External ear normal.       Mild pansinus tenderness  Eyes: Conjunctivae and EOM are normal.  Neck: Normal range of motion.  Cardiovascular: Normal rate and normal heart sounds.   Pulmonary/Chest: Effort normal and breath sounds normal. No respiratory distress.       A few coarse rhonchi and faint expiratory wheezing  Abdominal: Bowel sounds are normal.  Musculoskeletal: Normal range of motion. He exhibits no edema and no tenderness.  Neurological: He is alert and oriented to person, place, and time.  Psychiatric: He has a normal mood and affect. His behavior is normal.          Assessment & Plan:    As noted bronchitis. We'll treat with expectorants antitussives. We'll treat with doxycycline 100 mg twice daily for one week. He'll call if fever worsens or he develops worsening wheezing or shortness of breath

## 2011-08-18 NOTE — Patient Instructions (Signed)
Get plenty of rest, Drink lots of  clear liquids, and use Tylenol or ibuprofen for fever and discomfort.    Take your antibiotic as prescribed until ALL of it is gone, but stop if you develop a rash, swelling, or any side effects of the medication.  Contact our office as soon as possible if  there are side effects of the medication.  Call or return to clinic prn if these symptoms worsen or fail to improve as anticipated.   

## 2011-09-05 LAB — DIFFERENTIAL
Basophils Absolute: 0
Basophils Relative: 0
Lymphocytes Relative: 9 — ABNORMAL LOW
Lymphs Abs: 1.5
Monocytes Absolute: 1.4 — ABNORMAL HIGH
Neutro Abs: 13.1 — ABNORMAL HIGH

## 2011-09-05 LAB — POCT I-STAT, CHEM 8
Calcium, Ion: 1.15
Chloride: 106
Creatinine, Ser: 1.2
Hemoglobin: 17.3 — ABNORMAL HIGH
Potassium: 3.9
Sodium: 141

## 2011-09-05 LAB — CBC
Hemoglobin: 17.1 — ABNORMAL HIGH
Platelets: 229
RBC: 5.61

## 2011-09-08 ENCOUNTER — Other Ambulatory Visit: Payer: Self-pay

## 2011-09-08 MED ORDER — HYDROCODONE-ACETAMINOPHEN 5-500 MG PO TABS: 1.0000 | ORAL_TABLET | Freq: Four times a day (QID) | ORAL | Status: DC | PRN

## 2011-09-08 NOTE — Telephone Encounter (Signed)
[facility]in 5-500 last written 06/29/11 #50 2RF Last seen 08/18/11 Please advise

## 2011-09-08 NOTE — Telephone Encounter (Signed)
ok 

## 2011-09-08 NOTE — Telephone Encounter (Signed)
[facility] 

## 2011-11-02 ENCOUNTER — Ambulatory Visit (INDEPENDENT_AMBULATORY_CARE_PROVIDER_SITE_OTHER): Payer: BC Managed Care – PPO | Admitting: Family Medicine

## 2011-11-02 ENCOUNTER — Encounter: Payer: Self-pay | Admitting: Family Medicine

## 2011-11-02 VITALS — BP 132/90 | HR 92 | Temp 98.4°F | Wt 239.0 lb

## 2011-11-02 DIAGNOSIS — M75100 Unspecified rotator cuff tear or rupture of unspecified shoulder, not specified as traumatic: Secondary | ICD-10-CM

## 2011-11-02 DIAGNOSIS — M7550 Bursitis of unspecified shoulder: Secondary | ICD-10-CM

## 2011-11-02 MED ORDER — IBUPROFEN 800 MG PO TABS: 800.0000 mg | ORAL_TABLET | Freq: Four times a day (QID) | ORAL | Status: AC | PRN

## 2011-11-02 NOTE — Progress Notes (Signed)
  Subjective:    Patient ID: Stephen Armstrong, male    DOB: Aug 10, 1981, 30 y.o.   MRN: [id number]  HPI Here for 5 days of sharp severe pains in the right shoulder. He has never had these before. No recent trauma but he did throw a lot of footballs over the past weekend. Using ice. Vicodin does not help.    Review of Systems  Constitutional: Negative.   Musculoskeletal: Positive for arthralgias.       Objective:   Physical Exam  Constitutional: He appears well-developed and well-nourished.  Neck: Normal range of motion. Neck supple.  Musculoskeletal:       Tender in the anterior right shoulder, full ROM  Lymphadenopathy:    He has no cervical adenopathy.          Assessment & Plan:  Rest, Motrin, ice. Recheck prn

## 2011-11-24 ENCOUNTER — Other Ambulatory Visit: Payer: Self-pay | Admitting: Internal Medicine

## 2012-01-24 ENCOUNTER — Other Ambulatory Visit: Payer: Self-pay

## 2012-01-24 MED ORDER — HYDROCODONE-ACETAMINOPHEN 5-500 MG PO TABS: 1.0000 | ORAL_TABLET | Freq: Four times a day (QID) | ORAL | Status: DC | PRN

## 2012-01-24 NOTE — Telephone Encounter (Signed)
Called in.

## 2012-01-24 NOTE — Telephone Encounter (Signed)
[facility] for vicodin 5-500  Last seen 11/02/11 Last written 09/08/11  #50 2Rf Please asdvise

## 2012-01-24 NOTE — Telephone Encounter (Signed)
ok 

## 2012-02-15 ENCOUNTER — Ambulatory Visit: Payer: BC Managed Care – PPO | Admitting: Family

## 2012-02-16 ENCOUNTER — Ambulatory Visit (INDEPENDENT_AMBULATORY_CARE_PROVIDER_SITE_OTHER): Payer: BC Managed Care – PPO | Admitting: Internal Medicine

## 2012-02-16 ENCOUNTER — Encounter: Payer: Self-pay | Admitting: Internal Medicine

## 2012-02-16 VITALS — BP 118/78 | HR 99 | Temp 98.1°F | Wt 244.0 lb

## 2012-02-16 DIAGNOSIS — J069 Acute upper respiratory infection, unspecified: Secondary | ICD-10-CM

## 2012-02-16 MED ORDER — HYDROCODONE-HOMATROPINE 5-1.5 MG/5ML PO SYRP: 5.0000 mL | ORAL_SOLUTION | Freq: Four times a day (QID) | ORAL | Status: AC | PRN

## 2012-02-16 NOTE — Progress Notes (Signed)
  Subjective:    Patient ID: Stephen Armstrong, male    DOB: [birth year], 31 y.o.   MRN: [id number]  HPI  31 year old patient with a one-week history of head and chest congestion and productive cough he has had headache sore throat nasal congestion and fatigue. No documented fever he has felt generally weak anorexic and achy.  He is accompanied by his wife he does describe loud snoring and disrupted sleep. He does admit to daytime sleepiness    Review of Systems  HENT: Positive for congestion and rhinorrhea.   Respiratory: Positive for cough.   Psychiatric/Behavioral: Positive for sleep disturbance.       Objective:   Physical Exam  Constitutional: He is oriented to person, place, and time. He appears well-developed.  HENT:  Head: Normocephalic.  Right Ear: External ear normal.  Left Ear: External ear normal.       Pharyngeal crowding  Eyes: Conjunctivae and EOM are normal.  Neck: Normal range of motion.  Cardiovascular: Normal rate and normal heart sounds.   Pulmonary/Chest: Breath sounds normal.  Abdominal: Bowel sounds are normal.  Musculoskeletal: Normal range of motion. He exhibits no edema and no tenderness.  Neurological: He is alert and oriented to person, place, and time.  Psychiatric: He has a normal mood and affect. His behavior is normal.          Assessment & Plan:   Viral URI Sleep apnea suspect  Will treat  Symptomatically.  Sleep study encouraged they'll consider and call to set up

## 2012-02-16 NOTE — Patient Instructions (Signed)
Duexis one twice daily  Get plenty of rest, Drink lots of  clear liquids, and use Tylenol or ibuprofen for fever and discomfort.    Consider sleep study

## 2012-03-19 ENCOUNTER — Encounter: Payer: Self-pay | Admitting: Internal Medicine

## 2012-03-19 ENCOUNTER — Ambulatory Visit (INDEPENDENT_AMBULATORY_CARE_PROVIDER_SITE_OTHER): Payer: BC Managed Care – PPO | Admitting: Internal Medicine

## 2012-03-19 VITALS — BP 112/70 | Temp 98.5°F | Wt 240.0 lb

## 2012-03-19 DIAGNOSIS — M549 Dorsalgia, unspecified: Secondary | ICD-10-CM

## 2012-03-19 DIAGNOSIS — IMO0002 Reserved for concepts with insufficient information to code with codable children: Secondary | ICD-10-CM

## 2012-03-19 MED ORDER — CYCLOBENZAPRINE HCL 10 MG PO TABS: 10.0000 mg | ORAL_TABLET | Freq: Three times a day (TID) | ORAL | Status: DC | PRN

## 2012-03-19 MED ORDER — OXYCODONE-ACETAMINOPHEN 5-325 MG PO TABS: 1.0000 | ORAL_TABLET | ORAL | Status: AC | PRN

## 2012-03-19 MED ORDER — METHYLPREDNISOLONE ACETATE 80 MG/ML IJ SUSP
80.0000 mg | Freq: Once | INTRAMUSCULAR | Status: AC
  Administered 2012-03-19: 80 mg via INTRAMUSCULAR

## 2012-03-19 NOTE — Patient Instructions (Addendum)
Most patients with low back pain will improve with time over the next two weeks.   Keep active but avoid any activities that cause pain.  Apply moist heat to the low back area several times daily.  Aleve  2 twice dailyBack Pain, Adult Low back pain is very common. About 1 in 5 people have back pain. The cause of low back pain is rarely dangerous. The pain often gets better over time. About half of people with a sudden onset of back pain feel better in just 2 weeks. About 8 in 10 people feel better by 6 weeks.   CAUSES Some common causes of back pain include:  Strain of the muscles or ligaments supporting the spine.   Wear and tear (degeneration) of the spinal discs.   Arthritis.   Direct injury to the back.  DIAGNOSIS Most of the time, the direct cause of low back pain is not known. However, back pain can be treated effectively even when the exact cause of the pain is unknown. Answering your caregiver's questions about your overall health and symptoms is one of the most accurate ways to make sure the cause of your pain is not dangerous. If your caregiver needs more information, he or she may order lab work or imaging tests (X-rays or MRIs). However, even if imaging tests show changes in your back, this usually does not require surgery. HOME CARE INSTRUCTIONS For many people, back pain returns. Since low back pain is rarely dangerous, it is often a condition that people can learn to manage on their own.    Remain active. It is stressful on the back to sit or stand in one place. Do not sit, drive, or stand in one place for more than 30 minutes at a time. Take short walks on level surfaces as soon as pain allows. Try to increase the length of time you walk each day.   Do not stay in bed. Resting more than 1 or 2 days can delay your recovery.   Do not avoid exercise or work. Your body is made to move. It is not dangerous to be active, even though your back may hurt. Your back will likely heal  faster if you return to being active before your pain is gone.   Pay attention to your body when you  bend and lift. Many people have less discomfort when lifting if they bend their knees, keep the load close to their bodies, and avoid twisting. Often, the most comfortable positions are those that put less stress on your recovering back.   Find a comfortable position to sleep. Use a firm mattress and lie on your side with your knees slightly bent. If you lie on your back, put a pillow under your knees.   Only take over-the-counter or prescription medicines as directed by your caregiver. Over-the-counter medicines to reduce pain and inflammation are often the most helpful. Your caregiver may prescribe muscle relaxant drugs. These medicines help dull your pain so you can more quickly return to your normal activities and healthy exercise.   Put ice on the injured area.   Put ice in a plastic bag.   Place a towel between your skin and the bag.   Leave the ice on for 15 to 20 minutes, 3 to 4 times a day for the first 2 to 3 days. After that, ice and heat may be alternated to reduce pain and spasms.   Ask your caregiver about trying back exercises and gentle massage.  This may be of some benefit.   Avoid feeling anxious or stressed. Stress increases muscle tension and can worsen back pain. It is important to recognize when you are anxious or stressed and learn ways to manage it. Exercise is a great option.  SEEK MEDICAL CARE IF:  You have pain that is not relieved with rest or medicine.   You have pain that does not improve in 1 week.   You have new symptoms.   You are generally not feeling well.  SEEK IMMEDIATE MEDICAL CARE IF:    You have pain that radiates from your back into your legs.   You develop new bowel or bladder control problems.   You have unusual weakness or numbness in your arms or legs.   You develop nausea or vomiting.   You develop abdominal pain.   You feel faint.    Document Released: 11/21/2005 Document Revised: 11/10/2011 Document Reviewed: 04/11/2011 [facility] Patient Information 2012 [place], [place].

## 2012-03-19 NOTE — Progress Notes (Signed)
  Subjective:    Patient ID: Stephen Armstrong, male    DOB: September 08, 1981, 31 y.o.   MRN: [id number]  HPI  31 year old patient who has a prior history of a right lumbar radiculopathy requiring surgery. 2 days ago he stepped off a curb and stumbled and landed on her leg with acute pain in the right lumbar area. Pain is fairly diffuse in the lower lumbar area but also in the right flank region there is no radicular component. He has had no motor weakness. He is able to find a comfortable position during the night and sleep. He attempted to go to work yesterday but this aggravated the pain    Review of Systems  Musculoskeletal: Positive for back pain.       Objective:   Physical Exam  Constitutional:       Overweight Uncomfortable Standing and pain with transfer to the examining table  Musculoskeletal:       Tenderness in the lumbar area Tenderness also in the right flank region  Neurological:       Able to stand on his toes and heels without difficulty          Assessment & Plan:   Acute lumbar strain.  We'll treat with anti-inflammatories. We'll give 80 mg of Depo-Medrol IM. Will place on analgesics rest and most relaxers.

## 2012-03-21 ENCOUNTER — Telehealth: Payer: Self-pay | Admitting: Internal Medicine

## 2012-03-21 DIAGNOSIS — M549 Dorsalgia, unspecified: Secondary | ICD-10-CM

## 2012-03-21 NOTE — Telephone Encounter (Signed)
Pt called - referral done

## 2012-03-21 NOTE — Telephone Encounter (Signed)
Please advise - we saw earlier this week - gave depo injection

## 2012-03-21 NOTE — Telephone Encounter (Signed)
Pt called and was informed to try to get in with back specialist this wk. Pt is wanting to know what back specialist to go to?

## 2012-03-21 NOTE — Telephone Encounter (Signed)
Please try to get patient into see his back specialist this week (patient has had prior laminectomy)

## 2012-03-21 NOTE — Telephone Encounter (Signed)
Patient's spouse called stating that the patient's lower back pain is no better and he can not stand up straight. Please advise and inform patient of advice [phone].

## 2012-03-21 NOTE — Telephone Encounter (Signed)
See other phone note from today - order done

## 2012-03-21 NOTE — Telephone Encounter (Signed)
Attempt to call-both home and cell# - VM at both - LMTCB need to know if ok to move forward with making referral to back specialist r/t back pain and hx of laminectomy

## 2012-03-21 NOTE — Telephone Encounter (Signed)
Addended by: Duard Brady I on: 03/21/2012 01:04 PM   Modules accepted: Orders

## 2012-03-21 NOTE — Telephone Encounter (Signed)
Number listed for Stephen Armstrong in original call 'is no longer a working number' recording.

## 2012-03-22 ENCOUNTER — Telehealth: Payer: Self-pay | Admitting: Speech Pathology

## 2012-03-22 ENCOUNTER — Ambulatory Visit: Payer: BC Managed Care – PPO | Admitting: Internal Medicine

## 2012-03-22 NOTE — Telephone Encounter (Signed)
Pt is needing a note to stay out of work until his appt on Monday, 4/22 @ 9am.  Please return his call.

## 2012-03-22 NOTE — Telephone Encounter (Signed)
Pts wife called and said that pt needs a note that would excuse him from work until 03/27/12. Pls call when note is ready for pick up.

## 2012-03-22 NOTE — Telephone Encounter (Signed)
Attempt to call to see if he made it to neuro appt - please advise on letter to return to work

## 2012-04-17 ENCOUNTER — Other Ambulatory Visit: Payer: Self-pay

## 2012-04-17 MED ORDER — HYDROCODONE-ACETAMINOPHEN 5-500 MG PO TABS: 1.0000 | ORAL_TABLET | Freq: Four times a day (QID) | ORAL | Status: DC | PRN

## 2012-04-17 NOTE — Telephone Encounter (Signed)
Called in.

## 2012-04-17 NOTE — Telephone Encounter (Signed)
Ok to rf? 

## 2012-04-17 NOTE — Telephone Encounter (Signed)
Fax refill request for vicodin 5-500 Last seen 03/19/12 - back pain Last written 01/24/12 # 50 2RF Please advise

## 2012-08-09 ENCOUNTER — Other Ambulatory Visit: Payer: Self-pay

## 2012-08-09 MED ORDER — HYDROCODONE-ACETAMINOPHEN 5-500 MG PO TABS: 1.0000 | ORAL_TABLET | Freq: Four times a day (QID) | ORAL | Status: DC | PRN

## 2012-08-09 NOTE — Telephone Encounter (Signed)
[facility]-500 Last seen 03/17/12 back pain Last written 04/17/12 # 50 2rf Please advise

## 2012-08-09 NOTE — Telephone Encounter (Signed)
ok 

## 2012-08-09 NOTE — Telephone Encounter (Signed)
CALLED IN 

## 2012-09-21 ENCOUNTER — Other Ambulatory Visit: Payer: Self-pay | Admitting: Internal Medicine

## 2012-09-24 NOTE — Telephone Encounter (Signed)
Called in.

## 2012-09-24 NOTE — Telephone Encounter (Signed)
ok 

## 2012-09-24 NOTE — Telephone Encounter (Signed)
Last seen 03/19/12  Back pain Last written 08/09/12 # 50  2 RF Please advise

## 2012-11-07 ENCOUNTER — Other Ambulatory Visit: Payer: Self-pay | Admitting: Internal Medicine

## 2012-11-15 ENCOUNTER — Other Ambulatory Visit: Payer: Self-pay | Admitting: Internal Medicine

## 2013-01-05 ENCOUNTER — Other Ambulatory Visit: Payer: Self-pay | Admitting: Internal Medicine

## 2013-01-09 ENCOUNTER — Other Ambulatory Visit: Payer: Self-pay | Admitting: *Deleted

## 2013-01-09 MED ORDER — HYDROCODONE-ACETAMINOPHEN 5-325 MG PO TABS: 1.0000 | ORAL_TABLET | Freq: Four times a day (QID) | ORAL | Status: DC | PRN

## 2013-01-09 NOTE — Telephone Encounter (Signed)
Pharm called and needs new script for HYDROcodone-acetaminophen (VICODIN) 5-500 MG per tablet..now ( its 5-325-)

## 2013-02-07 ENCOUNTER — Other Ambulatory Visit: Payer: Self-pay | Admitting: Internal Medicine

## 2013-02-10 NOTE — Telephone Encounter (Signed)
Ok for #50  5-325

## 2013-03-15 ENCOUNTER — Other Ambulatory Visit: Payer: Self-pay | Admitting: Internal Medicine

## 2013-04-04 ENCOUNTER — Other Ambulatory Visit: Payer: Self-pay | Admitting: Internal Medicine

## 2013-04-22 ENCOUNTER — Other Ambulatory Visit: Payer: Self-pay | Admitting: Internal Medicine

## 2013-04-24 NOTE — Telephone Encounter (Signed)
Please advise if okay to refill. 

## 2013-04-25 NOTE — Telephone Encounter (Signed)
Ok 50

## 2013-05-27 ENCOUNTER — Other Ambulatory Visit: Payer: Self-pay | Admitting: Internal Medicine

## 2013-05-28 NOTE — Telephone Encounter (Signed)
ok 

## 2013-07-08 ENCOUNTER — Other Ambulatory Visit: Payer: Self-pay | Admitting: Internal Medicine

## 2015-08-19 ENCOUNTER — Encounter: Payer: Self-pay | Admitting: Family Medicine

## 2015-08-19 ENCOUNTER — Ambulatory Visit (INDEPENDENT_AMBULATORY_CARE_PROVIDER_SITE_OTHER): Payer: BLUE CROSS/BLUE SHIELD | Admitting: Family Medicine

## 2015-08-19 ENCOUNTER — Ambulatory Visit: Payer: Self-pay | Admitting: Adult Health

## 2015-08-19 VITALS — BP 138/70 | HR 70 | Temp 98.4°F | Ht 70.0 in | Wt 236.0 lb

## 2015-08-19 DIAGNOSIS — M7712 Lateral epicondylitis, left elbow: Secondary | ICD-10-CM | POA: Diagnosis not present

## 2015-08-19 MED ORDER — DICLOFENAC SODIUM 75 MG PO TBEC: 75.0000 mg | DELAYED_RELEASE_TABLET | Freq: Two times a day (BID) | ORAL | Status: AC

## 2015-08-19 NOTE — Progress Notes (Signed)
Pre visit review using our clinic review tool, if applicable. No additional management support is needed unless otherwise documented below in the visit note. 

## 2015-08-19 NOTE — Progress Notes (Signed)
   Subjective:    Patient ID: Stephen Armstrong, male    DOB: [birth year], 34 y.o.   MRN: [id number]  HPI Here for 4 weeks of pain in the left elbow. 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 Advil has not helped.    Review of Systems  Constitutional: Negative.   Musculoskeletal: Positive for arthralgias. Negative for joint swelling.       Objective:   Physical Exam  Constitutional: He appears well-developed and well-nourished.  Musculoskeletal: Normal range of motion. He exhibits no edema.  Tender over the left lateral epicondyle           Assessment & Plan:  Lateral epicondylitis. Treat with rest, ice, and Diclofenac 75 mg bid.

## 2017-08-30 ENCOUNTER — Telehealth: Payer: Self-pay | Admitting: Internal Medicine

## 2017-08-30 NOTE — Telephone Encounter (Signed)
Patient would like to switch PCP. Wanted ok from both providers.  °

## 2017-08-30 NOTE — Telephone Encounter (Signed)
ok 

## 2017-08-30 NOTE — Telephone Encounter (Signed)
Okay 

## 2017-08-31 NOTE — Telephone Encounter (Signed)
Called patient to schedule new patient appointment. Left VM.  °

## 2024-08-29 ENCOUNTER — Ambulatory Visit (HOSPITAL_BASED_OUTPATIENT_CLINIC_OR_DEPARTMENT_OTHER): Payer: Self-pay | Admitting: Family Medicine
